# Patient Record
Sex: Female | Born: 1935 | Race: White | Hispanic: Yes | Smoking: Never smoker
Health system: Southern US, Community
[De-identification: ages and names within clinical notes are randomized; demographics above are authoritative.]

## PROBLEM LIST (undated history)

## (undated) DIAGNOSIS — I10 Essential (primary) hypertension: Secondary | ICD-10-CM

## (undated) DIAGNOSIS — E349 Endocrine disorder, unspecified: Secondary | ICD-10-CM

## (undated) DIAGNOSIS — C55 Malignant neoplasm of uterus, part unspecified: Secondary | ICD-10-CM

## (undated) HISTORY — DX: Essential (primary) hypertension: I10

## (undated) HISTORY — DX: Malignant neoplasm of uterus, part unspecified: C55

## (undated) HISTORY — DX: Endocrine disorder, unspecified: E34.9

---

## 1995-11-19 DIAGNOSIS — C55 Malignant neoplasm of uterus, part unspecified: Secondary | ICD-10-CM

## 1995-11-19 HISTORY — DX: Malignant neoplasm of uterus, part unspecified: C55

## 2015-09-28 ENCOUNTER — Other Ambulatory Visit: Payer: Self-pay

## 2015-09-28 ENCOUNTER — Other Ambulatory Visit (HOSPITAL_COMMUNITY)
Admission: RE | Admit: 2015-09-28 | Discharge: 2015-09-28 | Disposition: A | Discharge status: Home / Self Care | Payer: Self-pay | Source: Ambulatory Visit | Attending: Gynecology | Admitting: Gynecology

## 2015-09-28 ENCOUNTER — Encounter: Payer: Self-pay | Admitting: Gynecology

## 2015-09-28 ENCOUNTER — Ambulatory Visit (INDEPENDENT_AMBULATORY_CARE_PROVIDER_SITE_OTHER): Payer: Self-pay | Admitting: Gynecology

## 2015-09-28 VITALS — Ht <= 58 in | Wt 117.0 lb

## 2015-09-28 DIAGNOSIS — R7989 Other specified abnormal findings of blood chemistry: Secondary | ICD-10-CM

## 2015-09-28 DIAGNOSIS — I10 Essential (primary) hypertension: Secondary | ICD-10-CM

## 2015-09-28 DIAGNOSIS — Z01411 Encounter for gynecological examination (general) (routine) with abnormal findings: Secondary | ICD-10-CM | POA: Insufficient documentation

## 2015-09-28 DIAGNOSIS — Z1272 Encounter for screening for malignant neoplasm of vagina: Secondary | ICD-10-CM

## 2015-09-28 DIAGNOSIS — Z1151 Encounter for screening for human papillomavirus (HPV): Secondary | ICD-10-CM | POA: Insufficient documentation

## 2015-09-28 DIAGNOSIS — R1900 Intra-abdominal and pelvic swelling, mass and lump, unspecified site: Secondary | ICD-10-CM

## 2015-09-28 DIAGNOSIS — R748 Abnormal levels of other serum enzymes: Secondary | ICD-10-CM

## 2015-09-28 NOTE — Patient Instructions (Signed)
Marcador tumoral CA-125 (CA-125 Tumor Marker Test) POR QU ME DEBO REALIZAR ESTA PRUEBA? Esta prueba tiene Radiographer, therapeutic nivel del antgeno del cncer125 (CA-125) en la sangre. La prueba de marcador tumoral CA-125 puede ser til para Public affairs consultant de ovario y se realiza solamente si se considera que la mujer corre un alto riesgo de presentar este tipo de Hotel manager. El mdico puede recomendarle esta prueba si:  Usted tiene importantes antecedentes familiares de cncer de ovario.  Usted tiene un defecto gentico en el antgeno del cncer de mama (BRCA, por sus siglas en ingls). Si ya le han diagnosticado cncer de ovario, el mdico puede hacerle esta prueba como ayuda para identificar el grado de la enfermedad y Chief Technology Officer cmo responde al tratamiento. QU TIPO DE MUESTRA SE TOMA? Para esta prueba, se extrae Truddie Coco de [place]. Por lo general, para extraerla, se introduce una aguja en una vena. [place]? No se requiere una preparacin para esta prueba. [place]? Los valores de referencia son los valores saludables establecidos despus de realizarle el anlisis a un grupo grande de personas sanas. Pueden variar [employer], laboratorios y hospitales. Es su responsabilidad retirar el resultado del [place]. Consulte en el laboratorio o en el departamento en el que fue realizado el estudio cundo y cmo podr [employer]. El valor de referencia para esta prueba es de 0a 35unidades/ml o menos de 35unidades/l (unidades SI). [place]? Los niveles ms altos de CA-125 pueden indicar lo siguiente:  Ciertos tipos de cncer, entre ellos:  Cncer de ovario.  Cncer de pncreas.  Cncer de colon.  Cncer de pulmn.  Cncer de mama.  Linfomas.  Trastornos no cancerosos (benignos), entre ellos:  Cirrosis.  Embarazo.  Endometriosis.  Pancreatitis.  Enfermedad plvica  inflamatoria (EPI). Hable con el mdico [employer], las opciones de tratamiento y, si es necesario, la necesidad de Optometrist ms [place]. Hable con el mdico si tiene [employer].   Esta informacin no tiene Marine scientist el consejo del mdico. Asegrese de hacerle al mdico cualquier pregunta que tenga.   Document Released: 08/25/2013 Document Revised: 11/25/2014 Elsevier Interactive Patient Education 2016 [place] (CT Scan) Faythe Ghee computarizada (TC) es una radiografa especializada. Emplea rayos x y [place] computadora para tomar imgenes de diferentes zonas del cuerpo. Una TC ofrece una informacin ms detallada que un examen realizado con una radiografa normal. La TC proporciona datos de los rganos internos, las estructuras de los tejidos blandos, los vasos sanguneos y [place].  El escner para la tomografa computarizada es una gran mquina que posee una apertura a travs de la cual se hace mover al paciente para tomar las imgenes.  INFORME A SU MDICO:  Cualquier alergia que tenga.  Todos los L[employer]ell [employer], incluidos vitaminas, hierbas, gotas oftlmicas, cremas y medicamentos de venta libre.  Problemas previos que usted o los [employer]Health de su familia hayan tenido con el uso de anestsicos.  Enfermedades de [employer].  Cirugas previas.  Enfermedades. RIESGOS Y COMPLICACIONES  En general, se trata de un procedimiento seguro. Sin embargo, Games developer procedimiento, pueden surgir complicaciones. Las complicaciones posibles son:   Risk analyst a la sustancia de [place].  Desarrollo de cncer por exposicin excesiva a la radiacin. Este riesgo es mnimo. ANTES DEL [place] previo al [place], deje de tomar bebidas que contengan cafena. entre ellas, bebidas energizantes,  t, refrescos, caf y chocolate caliente.  El da del estudio:  Alrededor de 4 horas antes  del estudio, no coma ni beba nada, excepto agua, segn las instrucciones de su mdico.  No use alhajas. Deber quitarse toda, o casi toda, la ropa y deber colocarse una bata. PROCEDIMIENTO   Le pedirn que se recueste en una camilla y [employer] brazos por encima de la cabeza.  Si el estudio requiere el uso de la sustancia de [place], le insertarn una va intravenosa en el brazo. La sustancia de contraste se inyectar en la va intravenosa. Puede ser que sienta calor o un sabor metlico en la boca.  La camilla sobre la cual estar recostado se desplazar dentro de un equipo de gran tamao que har la exploracin.  Podr ver, Pharmacist, community y hablar con la persona que [place] el equipo mientras est dentro de [place]. Siga las indicaciones de esa persona.  El equipo de tomografa computarizada se mover alrededor suyo para [employer]xon [employer]il Corporation. No se mueva mientras est en funcionamiento. Esto ayudar a Ship broker.  Cuando se hayan tomado las mejores imgenes posibles, se apagar el equipo, La camilla se desplazar fuera de este. Luego le retirarn la va intravenosa. DESPUS DEL PROCEDIMIENTO  Consulte a su mdico cundo debe concurrir al control para Civil engineer, contracting los [employer].   Esta informacin no tiene Marine scientist el consejo del mdico. Asegrese de hacerle al mdico cualquier pregunta que tenga.   Document Released: 11/04/2005 Document Revised: 11/09/2013 Elsevier Interactive Patient Education [employer].

## 2015-09-28 NOTE — Progress Notes (Signed)
   Patient is a 81 year old new patient to the practice who was referred by her primary physician Dr. York Ram. Patient was accompanied by an interpreter as well as her daughter. Patient has been visiting here from [place]. We have no documentation only recollection's as well as information the daughter had from speaking with her mother through the years. It appears that over 20 years ago she was treated with external beam radiation as well as intracavitary brachythearapy for uterine cancer. When questioned why she did not have surgery the daughter responded that they had informed her mother that it was an early cancer and this was the best way to treat it? Patient denies any vaginal bleeding. When she saw Dr. Jimmye Norman recently she had been complaining of decreased appetite and weight loss and there was a questionable palpable abdominal mass. Her blood work at indicated that she had an elevated creatinine of 1.18 and her total protein was low at 4.9 as was her albumin at 2.7 and her calcium was low at 7.8. She is being treated by him for hypertension. Her hemoglobin and hematocrit recently were 11.3 and 33.7 respectively. Her hemoglobin A1c was 5.7 and her lipid profile was otherwise normal with exception of slightly elevated LDL at 138. I have no other documentation except the following ultrasound that was done in [place] as follows:  "Complete abdominal ultrasound. Aorta, vena cava and pancreas are normal in appearance. Liver normal in size with no focal lesion. Hepato-pedal flow and 12 a. No stones or biliary obstruction. Both kidneys are normal in size. Spleen normal size and appearance.  There is a 9.6 x 8.6 x 7.7 minimally complex cyst in the left lower quadrant and left pelvic region"  Exam: Weight 117 pounds  4 feet 9 inches tall  BMI 25.32 kg/m Gen. appearance: Frail-appearing [place] patient in no acute distress Abdomen: Fullness and tenderness left lower abdomen questionable  mass Pelvic: Atrophic changes Vagina very short atrophic questionable effect of radiation therapy versus prior hysterectomy and radiation therapy Rectal vaginal exam no masses palpated in the rectal area  Patient does not recall ever having had a colonoscopy in her country of [place].  Assessment/plan: 81 year old female from [place] with past history of uterine cancer with no report of any reductive surgery only external radiation and intracavitary brachy therapy. Unable to determine if her uterus is still present or not with a very small vaginal space attributed to prior radiation treatment. Patient will be scheduled for a CT of the abdomen and pelvis without contrast because of her hypertension and elevated serum creatinine (could be attributed to compression of mass on the ureter) we'll also obtain a CA 125 and await results and possible referral to GYN oncologist if she wants to receive treatment here in the [place] or return back to [place]. All this information was explained to the mother and patient in Spanish and information was also provided on CT scan as well as on CA 125.

## 2015-09-29 ENCOUNTER — Telehealth: Payer: Self-pay | Admitting: *Deleted

## 2015-09-29 DIAGNOSIS — R1900 Intra-abdominal and pelvic swelling, mass and lump, unspecified site: Secondary | ICD-10-CM

## 2015-09-29 LAB — CA 125: CA 125: 14 U/mL (ref <35)

## 2015-09-29 LAB — CYTOLOGY - PAP

## 2015-09-29 NOTE — Telephone Encounter (Signed)
-----   Message from Terrance Mass, MD sent at 09/28/2015 11:49 AM EST ----- Please schedule CT of abdomen and pelvis on this patient with large left pelvic mas. History of prior uterine cancer treated with internal and external radiation in [place] over 2o years ago. CT w/o contrast due to HTN and elevated serum Creatinine. Thanks

## 2015-09-29 NOTE — Telephone Encounter (Signed)
Appointment 10/06/15 @ 10:15 am at [facility] MRI department the address in [place] the name of building [facility] which [facility]. Rosemarie Ax will relay to patient

## 2015-10-06 ENCOUNTER — Ambulatory Visit (HOSPITAL_COMMUNITY)
Admission: RE | Admit: 2015-10-06 | Discharge: 2015-10-06 | Disposition: A | Discharge status: Home / Self Care | Payer: Self-pay | Source: Ambulatory Visit | Attending: Gynecology | Admitting: Gynecology

## 2015-10-06 DIAGNOSIS — K7689 Other specified diseases of liver: Secondary | ICD-10-CM | POA: Insufficient documentation

## 2015-10-06 DIAGNOSIS — Z923 Personal history of irradiation: Secondary | ICD-10-CM | POA: Insufficient documentation

## 2015-10-06 DIAGNOSIS — Z8542 Personal history of malignant neoplasm of other parts of uterus: Secondary | ICD-10-CM | POA: Insufficient documentation

## 2015-10-06 DIAGNOSIS — R1900 Intra-abdominal and pelvic swelling, mass and lump, unspecified site: Secondary | ICD-10-CM | POA: Insufficient documentation

## 2015-10-06 DIAGNOSIS — R935 Abnormal findings on diagnostic imaging of other abdominal regions, including retroperitoneum: Secondary | ICD-10-CM | POA: Insufficient documentation

## 2015-10-09 ENCOUNTER — Telehealth: Payer: Self-pay | Admitting: *Deleted

## 2015-10-09 NOTE — Telephone Encounter (Signed)
Per [facility] staff message "please make an appointment for this patient with Dr. Denman George GYN oncologist at the regional [facility] here in [place] for this 81 year old patient with a pelvic mass."  Appointment on 10/26/15 @ 10:30am with Dr.Rossi at [place] will call and relay to patient.

## 2015-10-26 ENCOUNTER — Ambulatory Visit: Payer: Self-pay | Attending: Gynecologic Oncology | Admitting: Gynecologic Oncology

## 2015-10-26 ENCOUNTER — Encounter: Payer: Self-pay | Admitting: Gynecologic Oncology

## 2015-10-26 VITALS — BP 163/78 | HR 78 | Temp 97.9°F | Resp 19 | Ht <= 58 in | Wt 105.1 lb

## 2015-10-26 DIAGNOSIS — R1900 Intra-abdominal and pelvic swelling, mass and lump, unspecified site: Secondary | ICD-10-CM | POA: Insufficient documentation

## 2015-10-26 DIAGNOSIS — N858 Other specified noninflammatory disorders of uterus: Secondary | ICD-10-CM

## 2015-10-26 DIAGNOSIS — K4090 Unilateral inguinal hernia, without obstruction or gangrene, not specified as recurrent: Secondary | ICD-10-CM

## 2015-10-26 DIAGNOSIS — R896 Abnormal cytological findings in specimens from other organs, systems and tissues: Secondary | ICD-10-CM | POA: Insufficient documentation

## 2015-10-26 DIAGNOSIS — Z8542 Personal history of malignant neoplasm of other parts of uterus: Secondary | ICD-10-CM

## 2015-10-26 DIAGNOSIS — IMO0002 Reserved for concepts with insufficient information to code with codable children: Secondary | ICD-10-CM

## 2015-10-26 DIAGNOSIS — B977 Papillomavirus as the cause of diseases classified elsewhere: Secondary | ICD-10-CM | POA: Insufficient documentation

## 2015-10-26 DIAGNOSIS — N857 Hematometra: Secondary | ICD-10-CM

## 2015-10-26 NOTE — Patient Instructions (Signed)
No cancer was identified today on examination and after scan review.  You have a left inguinal hernia, hematometria of the uterus, and vaginal stenosis.  Please call for any questions or concerns.  Follow up with your regular physicians in [place].  Hernia inguinal en [place] (Inguinal Hernia, Adult, Care After) Consulte este texto durante las prximas semanas. Estas indicaciones para despus de recibir el alta le proporcionan informacin general acerca de cmo deber cuidarse despus de dejar el hospital. El mdico le dar instrucciones especficas. El tratamiento se ha planificado de acuerdo con las prcticas mdicas disponibles ms recientes, pero ocasionalmente pueden ocurrir complicaciones inevitables. Si tiene problemas o surgen preguntas luego de recibir el alta, por favor comunquese con su mdico. INSTRUCCIONES PARA EL CUIDADO DOMICILIARIO  Aplique hielo en el lugar de la operacin.  Ponga el hielo en una bolsa plstica.  Colquese una toalla entre la piel y la bolsa de hielo.  Deje el hielo durante 15 a 20 minutos por vez, 3 a 4 veces por da, mientras est despierto.  Cambie el vendaje tal como se le indic.  Mantenga la herida limpia y seca. Lave suavemente la herida con agua y [place]. Seque la herida con suaves toques. Puede tomar duchas 24 a 48 horas luego de la [place]. No tome baos, no utilice piscinas ni baeras durante diez das, o segn las indicaciones del mdico.  Solo tome medicamentos de venta libre o recetados para Conservation officer, historic buildings, Health and safety inspector o la [place], segn le haya indicado el mdico.  Retome su dieta normal cuando le indiquen.  No levante objetos que pesen ms de 4 kg ni realice deportes de contacto durante 3 semanas, o segn las indicaciones. SOLICITE ATENCIN MDICA SI:  Presenta enrojecimiento, hinchazn o aumento del dolor en la herida.  Observa lquido (pus) en la zona de la herida.  Hay un drenaje en la herida que dura ms de Print production planner.  La temperatura oral se eleva sin motivo por arriba de 38,9 C (102 F).  Advierte un olor ftido que proviene de la herida o del vendaje.  La herida se abre luego de que le han extrado los puntos (suturas).  Nota un incremento del dolor en los hombros (en la zona donde van los tirantes).  Presenta episodios de mareos o se siente dbil cuando est de pie.  Si tiene Higher education careers adviser (nuseas) o vmitos. SOLICITE ATENCIN MDICA INMEDIATAMENTE SI:  Aparece una erupcin cutnea.  Presenta dificultades respiratorias.  Aparece alguna reaccin o efecto secundario por los medicamentos administrados. ASEGRESE QUE:  Comprende estas instrucciones.  Controlar su enfermedad.  Solicitar ayuda de inmediato si no mejora o empeora.   Esta informacin no tiene Marine scientist el consejo del mdico. Asegrese de hacerle al mdico cualquier pregunta que tenga.   Document Released: 04/24/2010 Document Revised: 03/21/2015 Elsevier Interactive Patient Education [employer].

## 2015-10-26 NOTE — Progress Notes (Signed)
Consult Note: Gyn-Onc  Consult was requested by Dr. Toney Rakes for the evaluation of Bonnie Huber 81 y.o. female with a pelvic mass  CC:  Chief Complaint  Patient presents with  . pelvic mass    New consult    Assessment/Plan:  [name]  is a 81 y.o.  year old with a left inguinal hernia and a benign asymptomatic hydro/hematometrium.  These abdomen allergies are unrelated and I believe the hematometrium was discovered incidentally on imaging to workup he should symptoms of a groin mass. It is likely that this parametria's long-standing. It appears benign and reassuring on imaging. His CA-125 is normal.  I do not believe that either of these conditions require urgent surgical intervention and is reasonable for her to follow-up in [place] for these conditions.  I explained this to the patient and her daughter with a Spanish-speaking interpreter present.  I discussed the importance of following up with her physician in [place] regarding workup of her HPV infection. She reports this has been reported in the past and he had wanted to do biopsies. I explained why this is important. I do not believe that she requires this biospy today as she will need to pay out of pocket and this is a non-urgent test.   HPI: Bonnie Huber is a 81 year old para 35 who is a resident of [place] and [place], Spanish-speaking only, who is seen in consultation at the request of Dr. Toney Rakes for a pelvic mass in the setting of a remote history of endometrial cancer.  The patient reports that she had endometrial cancer of an early stage diagnosed approximately 20 years ago. She was treated with primary radiation (external beam and intracavitary brachetherapy) with a complete response. She reports having close follow-up with her oncologist in [place] on an annual basis. She reports last follow-up was proximally one year ago. She reports that she is HPV positive and he had wanted to  perform a biopsy at that time but she declined due to cost.  She is here in the country visiting her daughter. She was reporting increased sensation of left lower quadrant discomfort and a mass that was getting larger over time. She reports that the mass is notable in standing but goes away when she lies flat. When she reported this symptom to her primary care doctor Dr. York Ram who [place], he ordered a CT scan of the abdomen and pelvis which was performed on 10/06/2015. This revealed a 8.8 x 8.3 x 8.4 cm cystic mass in the central pelvis. It is contiguous with the vagina and cervix and may represent the uterus. It was mineral lysed and calcific within. The lesion is homogeneous without evidence of internal architecture. Neither ovary was well visualized. There was no evidence of ascites, peritoneal disease, or other masses. A CA-125 was drawn on the same day and was normal at 14.    Current Meds:  Outpatient Encounter Prescriptions as of 10/26/2015  Medication Sig  . hydrochlorothiazide (HYDRODIURIL) 25 MG tablet Take 25 mg by mouth daily.  Marland Kitchen levothyroxine (SYNTHROID, LEVOTHROID) 25 MCG tablet Take 25 mcg by mouth daily before breakfast.  . lisinopril (PRINIVIL,ZESTRIL) 20 MG tablet Take 20 mg by mouth daily.   No facility-administered encounter medications on file as of 10/26/2015.    Allergy: No Known Allergies  Social Hx:   Social History   Social History  . Marital Status: Widowed    Spouse Name: N/A  . Number  of Children: N/A  . Years of Education: N/A   Occupational History  . Not on file.   Social History Main Topics  . Smoking status: Never Smoker   . Smokeless tobacco: Not on file  . Alcohol Use: No  . Drug Use: No  . Sexual Activity: No   Other Topics Concern  . Not on file   Social History Narrative    Past Surgical Hx: History reviewed. No pertinent past surgical history.  Past Medical Hx:  Past Medical History  Diagnosis Date  .  Hypertension   . Hormone disorder   . Uterine cancer ([facility]) 1997    Treated with chemo & radiation in [place]    Past Gynecological History:  She has a history of endometrial cancer in 1996 treated with primary radiation.  No LMP recorded. Patient is not currently having periods (Reason: Other).  Family Hx: History reviewed. No pertinent family history.  Review of Systems:  Constitutional  Feels well,   ENT Normal appearing ears and nares bilaterally Skin/Breast  No rash, sores, jaundice, itching, dryness Cardiovascular  No chest pain, shortness of breath, or edema  Pulmonary  No cough or wheeze.  Gastro Intestinal  No nausea, vomitting, or diarrhoea. No bright red blood per rectum, no abdominal pain, change in bowel movement, or constipation.  Genito Urinary  No frequency, urgency, dysuria, denies vaginal bleeding Musculo Skeletal  No myalgia, arthralgia, joint swelling or pain  Neurologic  No weakness, numbness, change in gait,  Psychology  No depression, anxiety, insomnia.   Vitals:  Blood pressure 163/78, pulse 78, temperature 97.9 F (36.6 C), temperature source Oral, resp. rate 19, height 4\' 9"  (1.448 m), weight 105 lb 1.6 oz (47.673 kg), SpO2 100 %.  Physical Exam: WD in NAD Neck  Supple NROM, without any enlargements.  Lymph Node Survey No cervical supraclavicular or inguinal adenopathy Cardiovascular  Pulse normal rate, regularity and rhythm. S1 and S2 normal.  Lungs  Clear to auscultation bilateraly, without wheezes/crackles/rhonchi. Good air movement.  Skin  No rash/lesions/breakdown  Psychiatry  Alert and oriented to person, place, and time  Abdomen  Normoactive bowel sounds, abdomen soft, non-tender and thin. No palpable masses. Upon standing, a soft, reducible left indirect inguinal hernia is appreciated.   Back No CVA tenderness Genito Urinary  Vulva/vagina: Normal external female genitalia.  No lesions. No discharge or bleeding.  Bladder/urethra:   Normal urethral meatus  Vagina: obliterated vagina secondary to prior radiation.  Cervix: unable to appreciate due to obliterated vagina  Uterus: unable to appreciate due to obliterated vagina. Smooth and cystic on rectal exam.    Adnexa: no discrete masses. Rectal  Cystic fullness anteriorally appreciated.  Extremities  No bilateral cyanosis, clubbing or edema.   Donaciano Eva, MD  10/26/2015, 5:56 PM

## 2016-07-09 IMAGING — CT CT ABD-PELV W/O CM
1 of 2 series · 14 of 32 positions shown, 18 images · non-contrast
Comparison: None.

CLINICAL DATA: Subsequent encounter for large left pelvic nodes.
Remote history of uterine cancer over 20 years ago treated with
"Internal and external radiation in [REDACTED]" .

EXAM:
CT ABDOMEN AND PELVIS WITHOUT CONTRAST
TECHNIQUE: Multidetector CT imaging of the abdomen and pelvis was performed
following the standard protocol without IV contrast.

[Series 2: rtn ap without · axial · non-contrast · 0.70mm/px · z∈[-429,-69]mm · 14 of 83 slices shown, 18 images]
[im 7/83  soft-tissue]
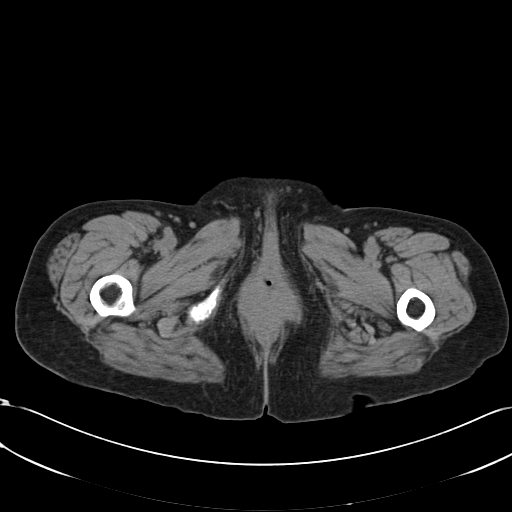
[im 7/83  bone]
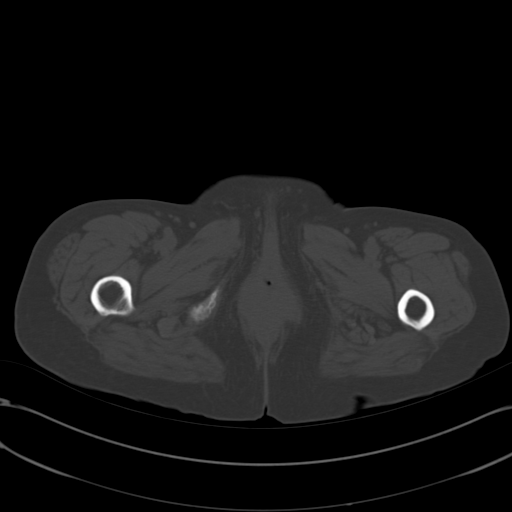
[im 14/83  soft-tissue]
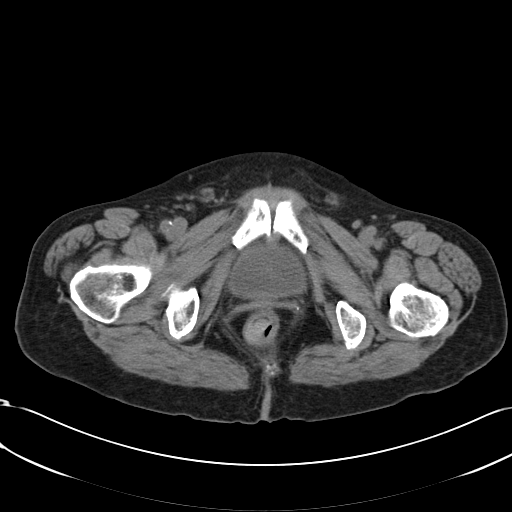
[im 20/83  soft-tissue]
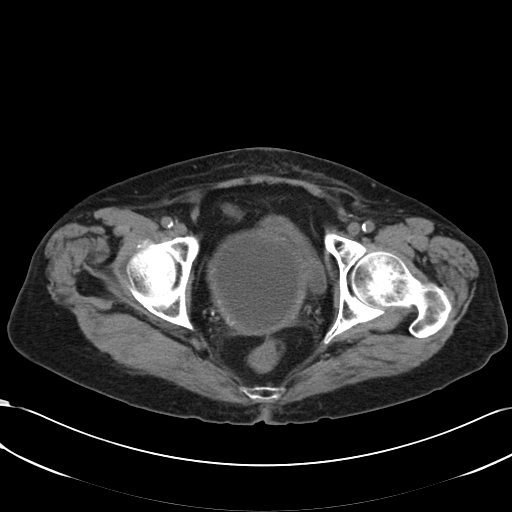
[im 27/83  soft-tissue]
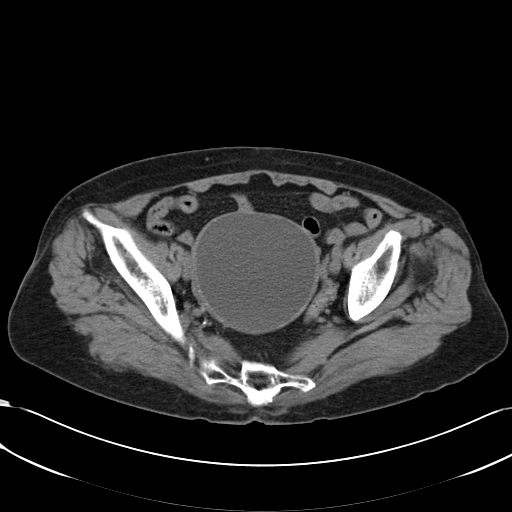
[im 33/83  soft-tissue]
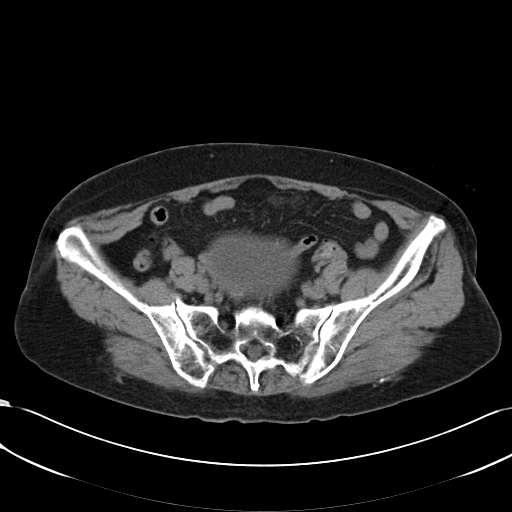
[im 40/83  soft-tissue]
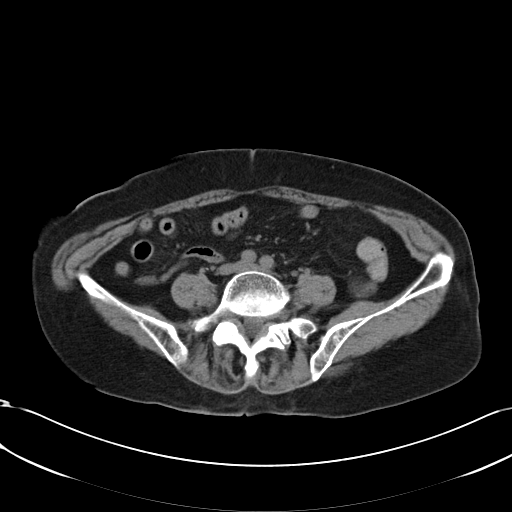
[im 46/83  soft-tissue]
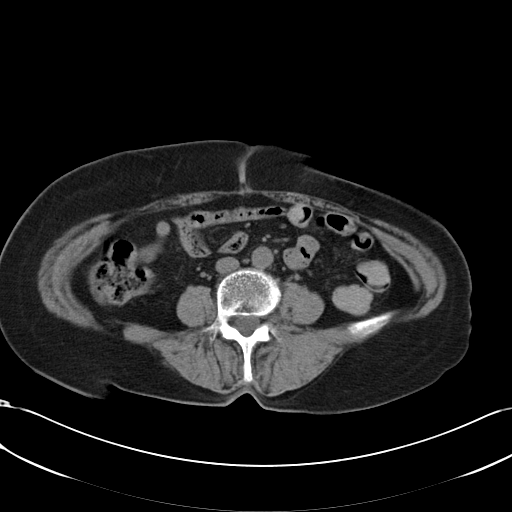
[im 53/83  soft-tissue]
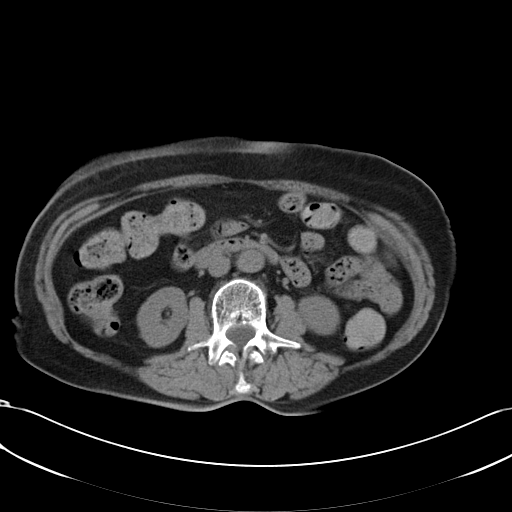
[im 60/83  soft-tissue]
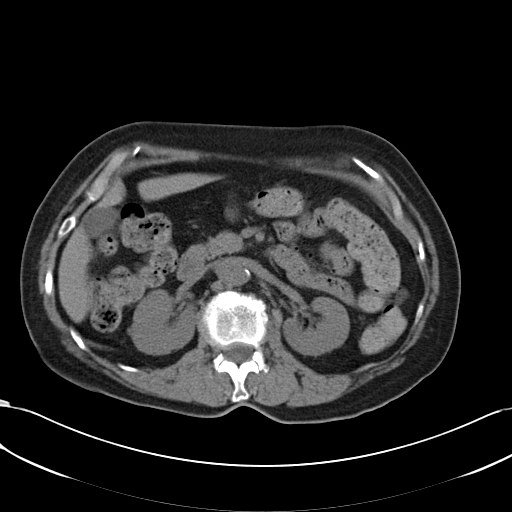
[im 60/83  bone]
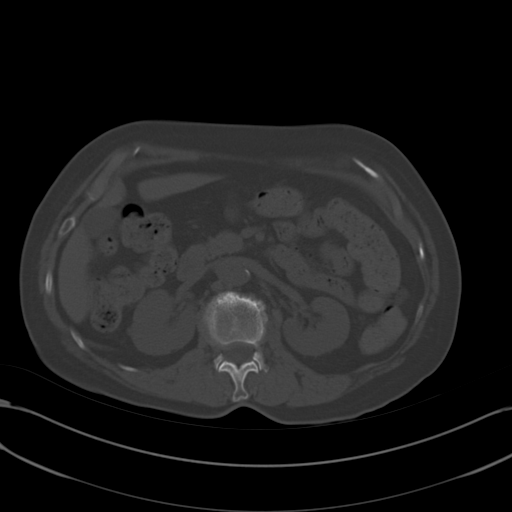
[im 66/83  soft-tissue]
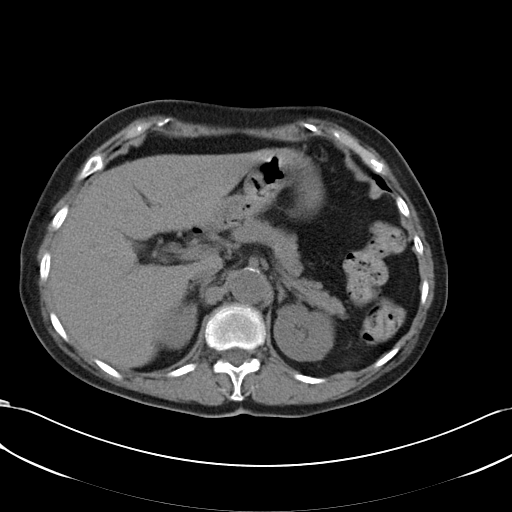
[im 69/83  lung]
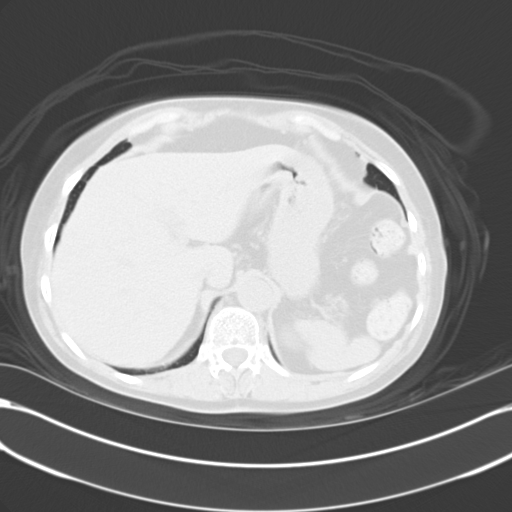
[im 73/83  soft-tissue]
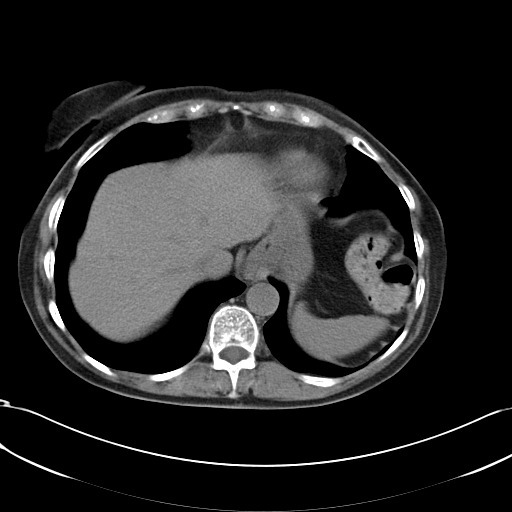
[im 73/83  lung]
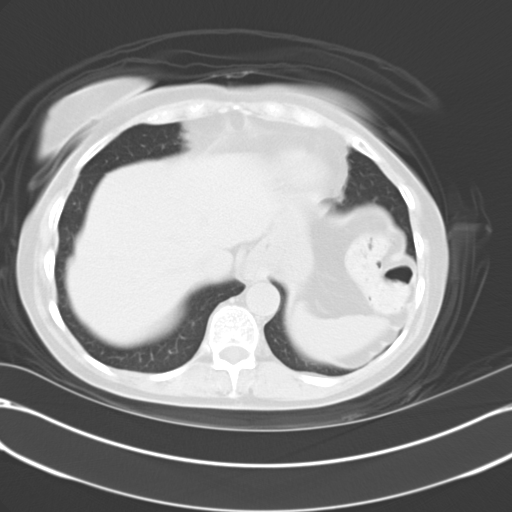
[im 76/83  lung]
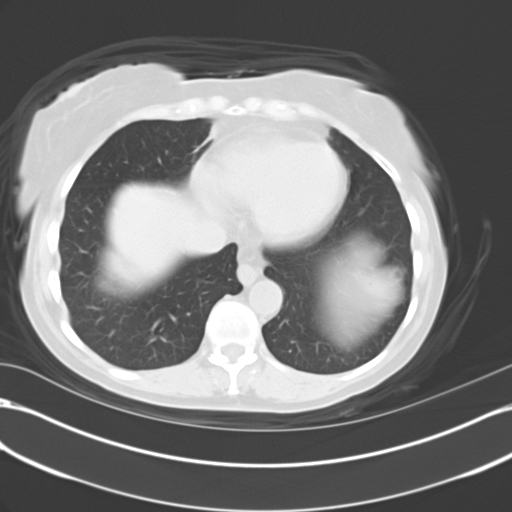
[im 79/83  soft-tissue]
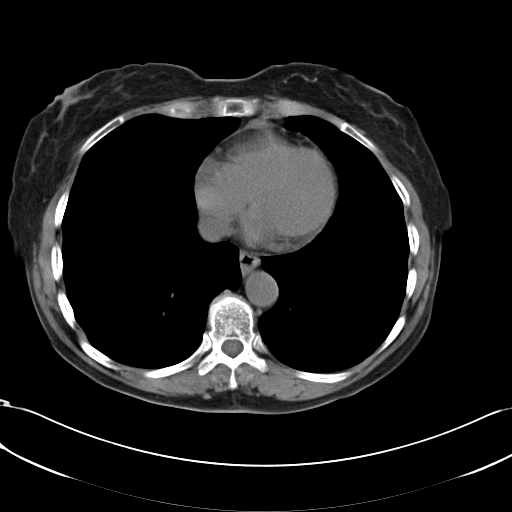
[im 79/83  lung]
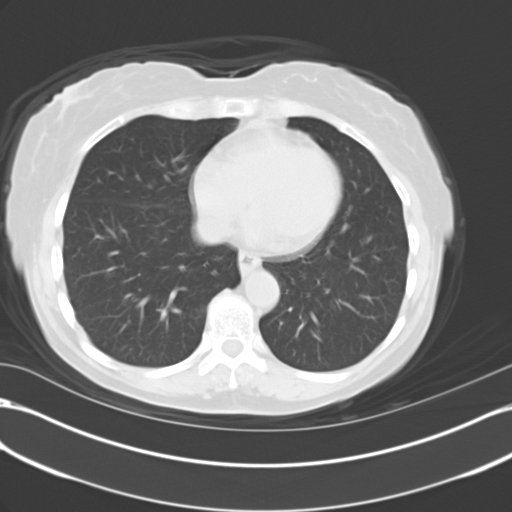

[14 of 32 positions shown; findings below may reference images not displayed]

FINDINGS: Lower chest:  Unremarkable.

Hepatobiliary: 8 mm hypo attenuating lesion in the lateral segment
left liver (image 22 series 2) measures near water attenuation, but
cannot be definitively characterized. There is no evidence for
gallstones, gallbladder wall thickening, or pericholecystic fluid.
No intrahepatic or extrahepatic biliary dilation.

Pancreas: No focal mass lesion. No dilatation of the main duct. No
intraparenchymal cyst. No peripancreatic edema.

Spleen: No splenomegaly. No focal mass lesion.

Adrenals/Urinary Tract: No adrenal nodule or mass. Kidneys are
unremarkable without focal mass lesion evident. No hydronephrosis.
No evidence for hydroureter. The urinary bladder appears normal for
the degree of distention.

Stomach/Bowel: Stomach is nondistended. No gastric wall thickening.
No evidence of outlet obstruction. Duodenum is normally positioned
as is the ligament of Treitz. No small bowel wall thickening. No
small bowel dilatation. The terminal ileum is normal. The appendix
is normal. No gross colonic mass. No colonic wall thickening. No
substantial diverticular change.

Vascular/Lymphatic: There is abdominal aortic atherosclerosis
without aneurysm. There is no gastrohepatic or hepatoduodenal
ligament lymphadenopathy. No intraperitoneal or retroperitoneal
lymphadenopy. No pelvic sidewall lymphadenopathy.

Reproductive: 8.8 x 8.3 x 8.4 cm cystic mass is identified in the
central anatomic pelvis. Sagittal images suggest that the lesion is
contiguous with the vagina/cervix and may represent the uterus (see
image 57 series 603). There is mineralization/calcification in the
wall of the lesion. Lesion is homogeneous without evidence for
internal architecture on this uninfused study. Neither ovary is
discretely visualized and gonadal vasculature tracks inferiorly to
the lateral walls of the cystic mass.

Other: Bilateral groin hernias are evident. No intraperitoneal free
fluid.

Musculoskeletal: Bone windows reveal no worrisome lytic or sclerotic
osseous lesions.
IMPRESSION: 1. Large 8-9 cm cystic mass identified in the central anatomic
pelvis. Assessment is limited by lack of intravenous contrast, but
the lesion appears to be uterine in origin given the apparent
continuous soft tissue between the vagina and this mass on sagittal
imaging. Ovarian neoplasm would also be a consideration although
bilateral gonadal vasculature appears to track down to the lateral
wall of this large cystic mass on each side. Likely MRI of the
pelvis without and with contrast would provide additional insight as
to internal architecture of this lesion and may be able to more
definitively characterize the origin as uterine.
2. No intraperitoneal free fluid and no evidence for pelvic
lymphadenopathy.
3. 8 mm hypo attenuating lesion in the left liver measures near
water attenuation and although cannot be definitively characterized,
is probably a cyst.

## 2017-04-02 ENCOUNTER — Encounter: Payer: Self-pay | Admitting: Gynecology
